# Patient Record
Sex: Female | Born: 1945 | Race: White | Hispanic: No | Marital: Married | Smoking: Current every day smoker
Health system: Southern US, Community
[De-identification: ages and names within clinical notes are randomized; demographics above are authoritative.]

## PROBLEM LIST (undated history)

## (undated) DIAGNOSIS — L503 Dermatographic urticaria: Secondary | ICD-10-CM

## (undated) DIAGNOSIS — I10 Essential (primary) hypertension: Secondary | ICD-10-CM

## (undated) DIAGNOSIS — E119 Type 2 diabetes mellitus without complications: Secondary | ICD-10-CM

## (undated) DIAGNOSIS — E7800 Pure hypercholesterolemia, unspecified: Secondary | ICD-10-CM

---

## 2015-08-30 ENCOUNTER — Other Ambulatory Visit: Payer: Self-pay

## 2015-08-30 ENCOUNTER — Emergency Department (HOSPITAL_BASED_OUTPATIENT_CLINIC_OR_DEPARTMENT_OTHER)
Admission: EM | Admit: 2015-08-30 | Discharge: 2015-08-31 | Disposition: A | Discharge status: Home / Self Care | Payer: Medicare Other | Attending: Emergency Medicine | Admitting: Emergency Medicine

## 2015-08-30 ENCOUNTER — Emergency Department (HOSPITAL_BASED_OUTPATIENT_CLINIC_OR_DEPARTMENT_OTHER): Payer: Medicare Other

## 2015-08-30 ENCOUNTER — Encounter (HOSPITAL_BASED_OUTPATIENT_CLINIC_OR_DEPARTMENT_OTHER): Payer: Self-pay | Admitting: Emergency Medicine

## 2015-08-30 DIAGNOSIS — E7800 Pure hypercholesterolemia, unspecified: Secondary | ICD-10-CM | POA: Insufficient documentation

## 2015-08-30 DIAGNOSIS — Z7984 Long term (current) use of oral hypoglycemic drugs: Secondary | ICD-10-CM | POA: Insufficient documentation

## 2015-08-30 DIAGNOSIS — E119 Type 2 diabetes mellitus without complications: Secondary | ICD-10-CM | POA: Diagnosis not present

## 2015-08-30 DIAGNOSIS — J440 Chronic obstructive pulmonary disease with acute lower respiratory infection: Secondary | ICD-10-CM | POA: Diagnosis not present

## 2015-08-30 DIAGNOSIS — F17200 Nicotine dependence, unspecified, uncomplicated: Secondary | ICD-10-CM | POA: Diagnosis not present

## 2015-08-30 DIAGNOSIS — I10 Essential (primary) hypertension: Secondary | ICD-10-CM | POA: Insufficient documentation

## 2015-08-30 DIAGNOSIS — J209 Acute bronchitis, unspecified: Secondary | ICD-10-CM | POA: Insufficient documentation

## 2015-08-30 DIAGNOSIS — Z79899 Other long term (current) drug therapy: Secondary | ICD-10-CM | POA: Insufficient documentation

## 2015-08-30 DIAGNOSIS — R05 Cough: Secondary | ICD-10-CM | POA: Diagnosis present

## 2015-08-30 HISTORY — DX: Pure hypercholesterolemia, unspecified: E78.00

## 2015-08-30 HISTORY — DX: Essential (primary) hypertension: I10

## 2015-08-30 HISTORY — DX: Dermatographic urticaria: L50.3

## 2015-08-30 HISTORY — DX: Type 2 diabetes mellitus without complications: E11.9

## 2015-08-30 MED ORDER — FLUTICASONE PROPIONATE HFA 44 MCG/ACT IN AERO
2.0000 | INHALATION_SPRAY | Freq: Two times a day (BID) | RESPIRATORY_TRACT | Status: DC
  Administered 2015-08-31: 2 via RESPIRATORY_TRACT
  Filled 2015-08-30: qty 10.6

## 2015-08-30 MED ORDER — ALBUTEROL SULFATE HFA 108 (90 BASE) MCG/ACT IN AERS
2.0000 | INHALATION_SPRAY | RESPIRATORY_TRACT | Status: DC | PRN
  Administered 2015-08-31: 2 via RESPIRATORY_TRACT
  Filled 2015-08-30: qty 6.7

## 2015-08-30 NOTE — ED Provider Notes (Signed)
CSN: [id number]     Arrival date & time 08/30/15  2155 History  By signing my name below, I, Linna Darner, attest that this documentation has been prepared under the direction and in the presence of physician practitioner, Paula Libra, MD. Electronically Signed: Linna Darner, Scribe. 08/30/2015. 11:48 PM.    Chief Complaint  Patient presents with  . Cough    The history is provided by the patient. No language interpreter was used.     HPI Comments: Tara Calhoun is a 70 y.o. female who presents to the Emergency Department complaining of cold-like symptoms for the last 5 days. She reports sore throat and rhinorrhea since onset. She states that she began to experience a productive cough with yellow mucus as well as a fever of 101.5 three days ago. The cough causes a burning sensation in her chest. She notes chest "heaviness" earlier today that is no longer present but denies frank pain. Pt went to Urgent Care three days ago for her symptoms; she had an x-ray taken which was reportedly negative for pneumonia. Pt declined an antibiotic from Urgent Care. She has been using OTC Sinus Pain and Congestion ([facility] brand) as well as generic Mucinex for her symptoms. Pt reports that her mucus has become more clear over the last few days. She denies chest pain, nausea, vomiting, diarrhea, and SOB.   Past Medical History  Diagnosis Date  . Hypertension   . Diabetes mellitus without complication (HCC)   . Hypercholesteremia   . Dermagraphy    History reviewed. No pertinent past surgical history. History reviewed. No pertinent family history. Social History  Substance Use Topics  . Smoking status: Current Every Day Smoker  . Smokeless tobacco: None  . Alcohol Use: No   OB History    No data available     Review of Systems  All other systems reviewed and are negative.   A complete 10 system review of systems was obtained and all systems are negative except as noted in the HPI and PMH.    Allergies  Review of patient's allergies indicates no known allergies.  Home Medications   Prior to Admission medications   Medication Sig Start Date End Date Taking? Authorizing Provider  baclofen (LIORESAL) 10 MG tablet Take 10 mg by mouth 3 (three) times daily.   Yes Historical Provider, MD  carvedilol (COREG) 25 MG tablet Take 25 mg by mouth 2 (two) times daily with a meal.   Yes Historical Provider, MD  cetirizine (ZYRTEC) 10 MG tablet Take 10 mg by mouth daily.   Yes Historical Provider, MD  gabapentin (NEURONTIN) 600 MG tablet Take 600 mg by mouth 4 (four) times daily.   Yes Historical Provider, MD  glimepiride (AMARYL) 4 MG tablet Take 4 mg by mouth daily with breakfast.   Yes Historical Provider, MD  metFORMIN (GLUCOPHAGE) 500 MG tablet Take by mouth 2 (two) times daily with a meal.   Yes Historical Provider, MD  rOPINIRole (REQUIP) 0.25 MG tablet Take 0.25 mg by mouth 3 (three) times daily.   Yes Historical Provider, MD  simvastatin (ZOCOR) 40 MG tablet Take 40 mg by mouth daily.   Yes Historical Provider, MD  sitaGLIPtin (JANUVIA) 100 MG tablet Take 100 mg by mouth daily.   Yes Historical Provider, MD   BP 169/92 mmHg  Pulse 95  Temp(Src) 99.3 F (37.4 C) (Oral)  Resp 20  Ht  (1.676 m)  Wt 168 lb (76.204 kg)  BMI 27.13 kg/m2  SpO2  98%   Physical Exam  General: Well-developed, well-nourished female in no acute distress; appearance consistent with age of record HENT: normocephalic; atraumatic Eyes: pupils equal, round and reactive to light; extraocular muscles intact Neck: supple Heart: regular rate and rhythm; no murmur Lungs: inspiratory wheezes in right base on deep breathing Abdomen: soft; nondistended; nontender; no masses or hepatosplenomegaly; bowel sounds present Extremities: No deformity; full range of motion; pulses normal Neurologic: Awake, alert and oriented; motor function intact in all extremities and symmetric; no facial droop Skin: Warm and  dry Psychiatric: Normal mood and affect  ED Course  Procedures (including critical care time)  DIAGNOSTIC STUDIES: Oxygen Saturation is 98% on RA, normal by my interpretation.    COORDINATION OF CARE: 11:48 PM Discussed treatment plan with pt at bedside and pt agreed to plan.  MDM  Nursing notes and vitals signs, including pulse oximetry, reviewed.  Summary of this visit's results, reviewed by myself:   EKG Interpretation  Date/Time:  Saturday Aug 30 2015 22:08:44 EDT Ventricular Rate:  83 PR Interval:  132 QRS Duration: 84 QT Interval:  368 QTC Calculation: 432 R Axis:   -71 Text Interpretation:  Normal sinus rhythm Left anterior fascicular block Septal infarct , age undetermined Abnormal ECG Confirmed by [facility], JULIE ([id number]) on 08/30/2015 10:32:36 PM       Imaging Studies: Dg Chest 2 View  08/30/2015  CLINICAL DATA:  Cough and congestion for 4 days. Left chest heaviness. Smoker. EXAM: CHEST  2 VIEW COMPARISON:  None. FINDINGS: Mild emphysematous changes in the lungs. Central airways thickening and interstitial changes likely representing chronic bronchitis. Normal heart size and pulmonary vascularity. No focal airspace disease or consolidation in the lungs. No blunting of costophrenic angles. No pneumothorax. Mediastinal contours appear intact. Calcified and tortuous aorta. Mild degenerative changes in the spine. IMPRESSION: Emphysematous changes and chronic bronchitic changes in the lungs. No evidence of active pulmonary disease. Electronically Signed   By: Burman NievesWilliam  Stevens M.D.   On: 08/30/2015 22:52   Patient advised of x-ray changes consistent with COPD. We will start her on albuterol and Flovent inhalers. We wish to avoid systemic steroids in light of her diabetes.  Final diagnoses:  COPD with acute bronchitis (HCC)   I personally performed the services described in this documentation, which was scribed in my presence. The recorded information has been reviewed and  is accurate.     Paula LibraJohn Malyia Moro, MD 08/31/15 [phone]

## 2015-08-30 NOTE — ED Notes (Signed)
Patient states that she had a cough and chest congestions since wed. Went to her PMD and they did x-ray and d/c'd with OTC medications. Patient states that she has had a "heaviness" to her chest on the left all day up until she laid down to take a nap. After nap the patient has had no heaviness. The patient has a noted course cough

## 2015-08-31 NOTE — ED Notes (Signed)
MD at bedside. 

## 2017-12-23 IMAGING — DX DG CHEST 2V
2 series · 2 of 2 positions shown · non-contrast
Comparison: None.

CLINICAL DATA: Cough and congestion for 4 days. Left chest
heaviness. Smoker.

EXAM:
CHEST  2 VIEW

[chest pa]
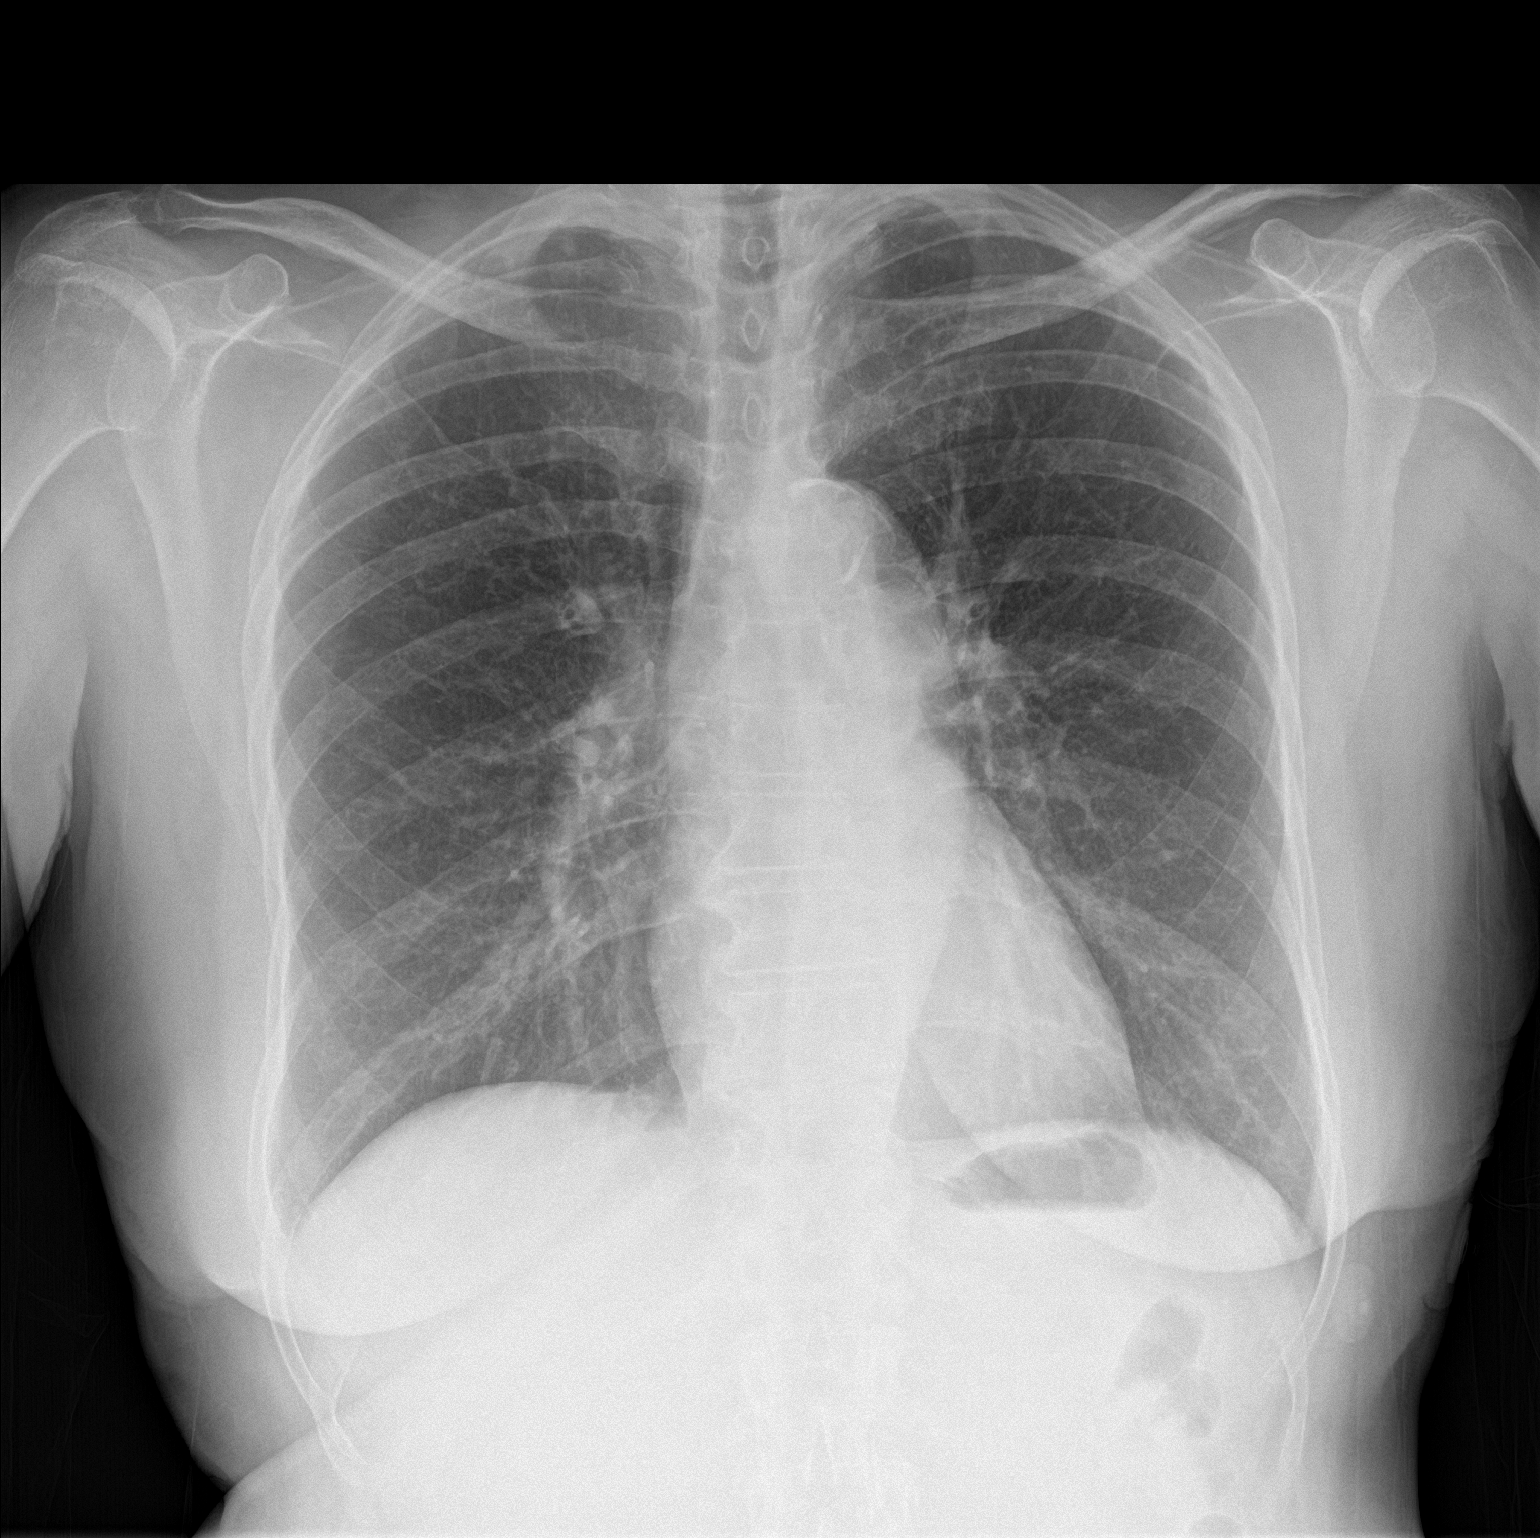

[chest lat]
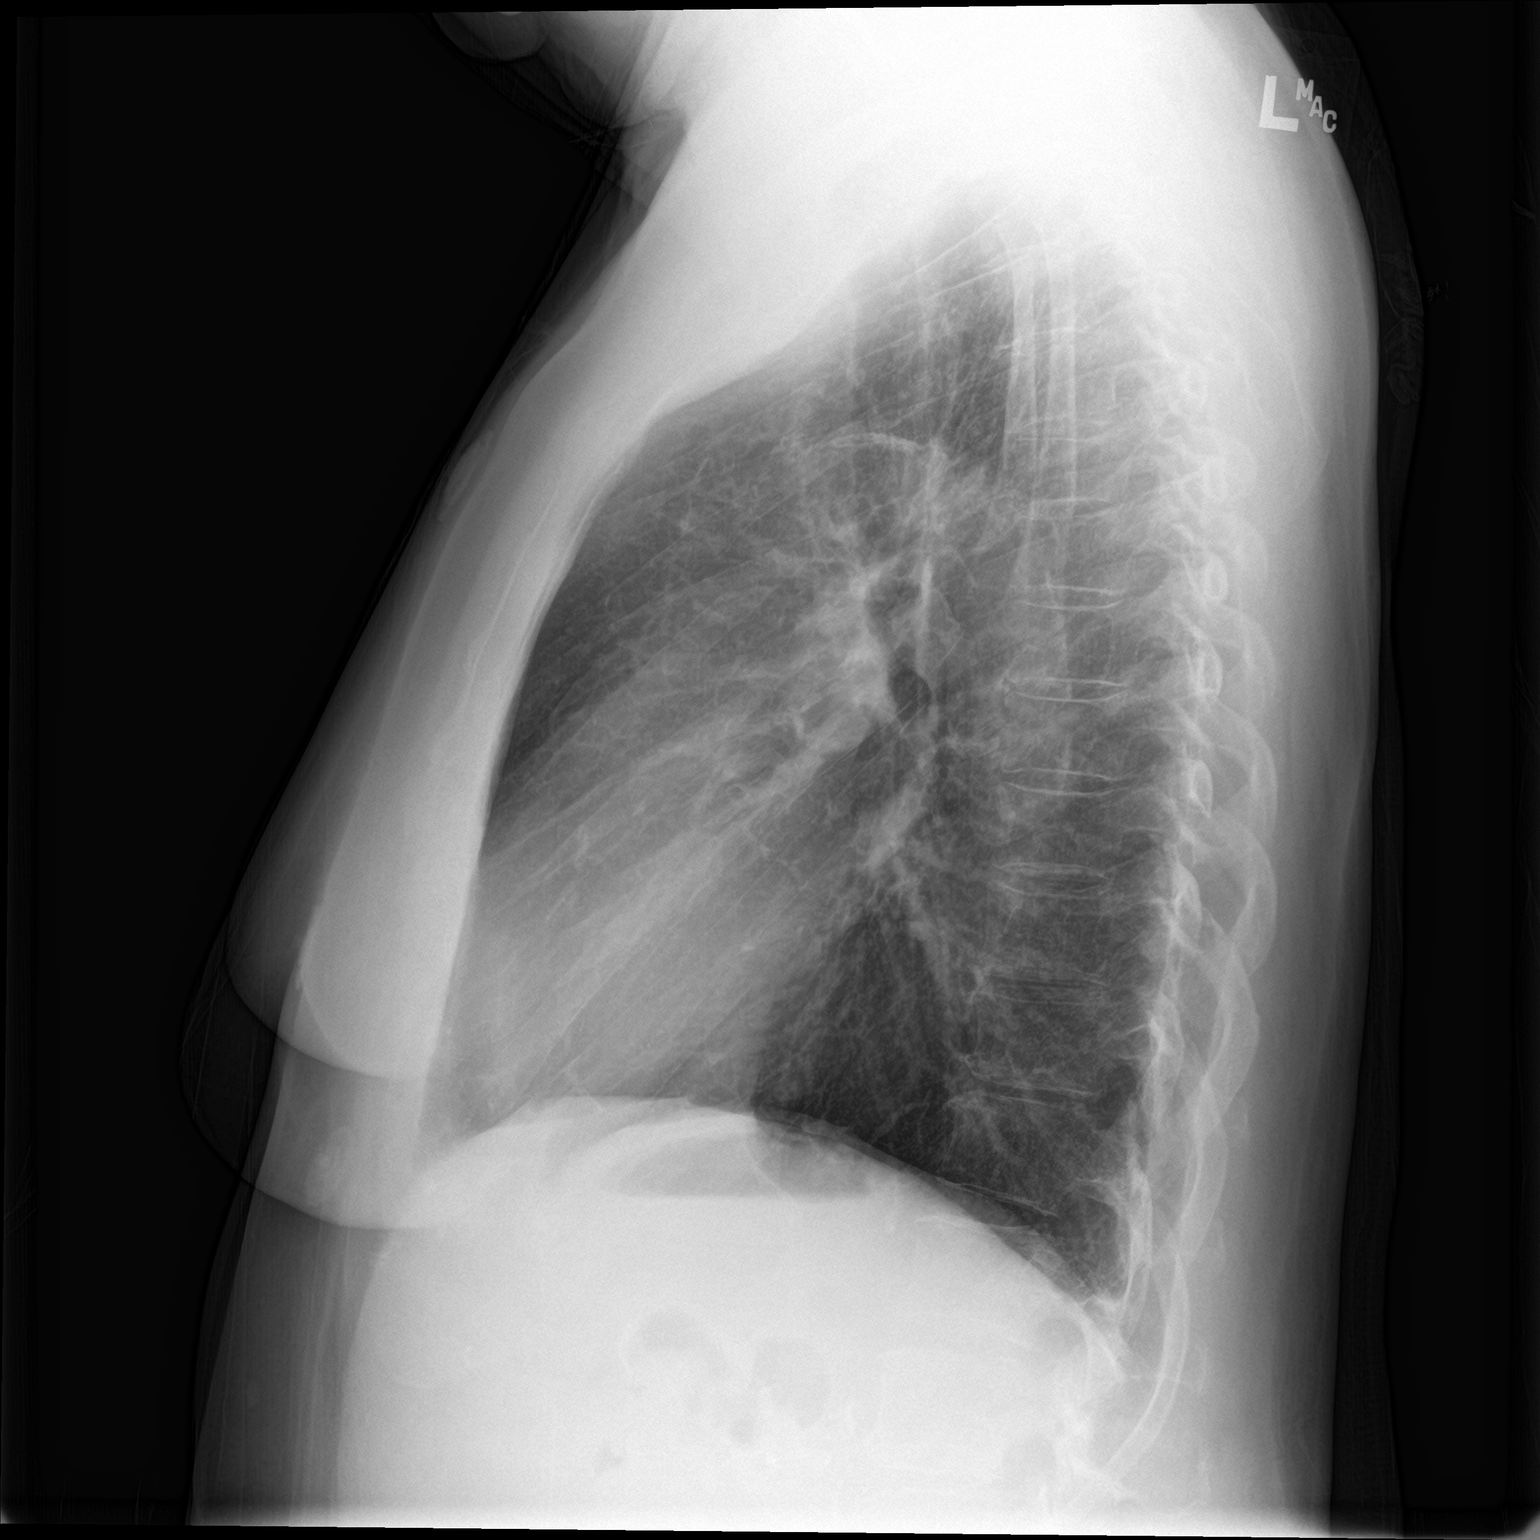

[2 of 2 positions shown; findings below may reference images not displayed]

FINDINGS: Mild emphysematous changes in the lungs. Central airways thickening
and interstitial changes likely representing chronic bronchitis.
Normal heart size and pulmonary vascularity. No focal airspace
disease or consolidation in the lungs. No blunting of costophrenic
angles. No pneumothorax. Mediastinal contours appear intact.
Calcified and tortuous aorta. Mild degenerative changes in the
spine.
IMPRESSION: Emphysematous changes and chronic bronchitic changes in the lungs.
No evidence of active pulmonary disease.
